# Patient Record
Sex: Male | Born: 1985 | Race: Black or African American | Hispanic: No | Smoking: Former smoker
Health system: Southern US, Community
[De-identification: ages and names within clinical notes are randomized; demographics above are authoritative.]

## PROBLEM LIST (undated history)

## (undated) DIAGNOSIS — N200 Calculus of kidney: Secondary | ICD-10-CM

## (undated) HISTORY — PX: HERNIA REPAIR: SHX51

---

## 2012-12-18 ENCOUNTER — Emergency Department (HOSPITAL_BASED_OUTPATIENT_CLINIC_OR_DEPARTMENT_OTHER): Payer: Self-pay

## 2012-12-18 ENCOUNTER — Encounter (HOSPITAL_BASED_OUTPATIENT_CLINIC_OR_DEPARTMENT_OTHER): Payer: Self-pay | Admitting: *Deleted

## 2012-12-18 ENCOUNTER — Emergency Department (HOSPITAL_BASED_OUTPATIENT_CLINIC_OR_DEPARTMENT_OTHER)
Admission: EM | Admit: 2012-12-18 | Discharge: 2012-12-18 | Disposition: A | Discharge status: Home / Self Care | Payer: Self-pay | Attending: Emergency Medicine | Admitting: Emergency Medicine

## 2012-12-18 DIAGNOSIS — Y929 Unspecified place or not applicable: Secondary | ICD-10-CM | POA: Insufficient documentation

## 2012-12-18 DIAGNOSIS — X500XXA Overexertion from strenuous movement or load, initial encounter: Secondary | ICD-10-CM | POA: Insufficient documentation

## 2012-12-18 DIAGNOSIS — N23 Unspecified renal colic: Secondary | ICD-10-CM | POA: Insufficient documentation

## 2012-12-18 DIAGNOSIS — Y9389 Activity, other specified: Secondary | ICD-10-CM | POA: Insufficient documentation

## 2012-12-18 DIAGNOSIS — F17200 Nicotine dependence, unspecified, uncomplicated: Secondary | ICD-10-CM | POA: Insufficient documentation

## 2012-12-18 DIAGNOSIS — Y990 Civilian activity done for income or pay: Secondary | ICD-10-CM | POA: Insufficient documentation

## 2012-12-18 LAB — URINALYSIS, ROUTINE W REFLEX MICROSCOPIC
Bilirubin Urine: NEGATIVE
Glucose, UA: NEGATIVE mg/dL
Nitrite: NEGATIVE
Specific Gravity, Urine: 1.024 (ref 1.005–1.030)
pH: 5.5 (ref 5.0–8.0)

## 2012-12-18 LAB — URINE MICROSCOPIC-ADD ON

## 2012-12-18 MED ORDER — OXYCODONE-ACETAMINOPHEN 5-325 MG PO TABS
ORAL_TABLET | ORAL | Status: AC
  Administered 2012-12-18: 2 via ORAL
  Filled 2012-12-18: qty 2

## 2012-12-18 MED ORDER — KETOROLAC TROMETHAMINE 60 MG/2ML IM SOLN
60.0000 mg | Freq: Once | INTRAMUSCULAR | Status: AC
  Administered 2012-12-18: 60 mg via INTRAMUSCULAR
  Filled 2012-12-18: qty 2

## 2012-12-18 MED ORDER — ONDANSETRON 4 MG PO TBDP
ORAL_TABLET | ORAL | Status: AC
  Administered 2012-12-18: 4 mg via ORAL
  Filled 2012-12-18: qty 1

## 2012-12-18 MED ORDER — ONDANSETRON 4 MG PO TBDP
4.0000 mg | ORAL_TABLET | Freq: Once | ORAL | Status: AC
  Administered 2012-12-18: 4 mg via ORAL

## 2012-12-18 MED ORDER — OXYCODONE-ACETAMINOPHEN 5-325 MG PO TABS: 2.0000 | ORAL_TABLET | ORAL | Status: DC | PRN

## 2012-12-18 MED ORDER — OXYCODONE-ACETAMINOPHEN 5-325 MG PO TABS
2.0000 | ORAL_TABLET | Freq: Once | ORAL | Status: AC
  Administered 2012-12-18: 2 via ORAL
  Filled 2012-12-18: qty 2

## 2012-12-18 NOTE — ED Notes (Signed)
MD at bedside. 

## 2012-12-18 NOTE — ED Provider Notes (Signed)
CSN: [id number]     Arrival date & time 12/18/12  1411 History   First MD Initiated Contact with Patient 12/18/12 1435     Chief Complaint  Patient presents with  . Testicle Pain   (Consider location/radiation/quality/duration/timing/severity/associated sxs/prior Treatment) Patient is a 27 y.o. male presenting with testicular pain.  Testicle Pain   Pt with history of kidney stones reports while he was at work yesterday evening lifting a box when he had sudden onset of severe aching R testicular pain, radiating into R flank. Not associated with testicular swelling, discharge or vomiting. He felt better during the night but pain returned today. This pain is not similar to previous kidney stones. Denies penile discharge recently but has had unprotected sex.   History reviewed. No pertinent past medical history. History reviewed. No pertinent past surgical history. History reviewed. No pertinent family history. History  Substance Use Topics  . Smoking status: Current Every Day Smoker -- 0.50 packs/day    Types: Cigarettes  . Smokeless tobacco: Not on file  . Alcohol Use: No    Review of Systems  Genitourinary: Positive for testicular pain.   All other systems reviewed and are negative except as noted in HPI.   Allergies  Review of patient's allergies indicates no known allergies.  Home Medications  No current outpatient prescriptions on file. BP 133/70  Pulse 72  Temp(Src) 97.7 F (36.5 C) (Oral)  Resp 16  Ht 5\' 6"  (1.676 m)  Wt 170 lb (77.111 kg)  BMI 27.45 kg/m2  SpO2 100% Physical Exam  Nursing note and vitals reviewed. Constitutional: He is oriented to person, place, and time. He appears well-developed and well-nourished.  HENT:  Head: Normocephalic and atraumatic.  Eyes: EOM are normal. Pupils are equal, round, and reactive to light.  Neck: Normal range of motion. Neck supple.  Cardiovascular: Normal rate, normal heart sounds and intact distal pulses.    Pulmonary/Chest: Effort normal and breath sounds normal.  Abdominal: Bowel sounds are normal. He exhibits no distension. There is no tenderness.  Genitourinary:  Uncircumcised, no discharge, no sores. Mild tenderness palpation R testicle which is retracted but normal orientation, L testicle non-tender. No inguinal hernias  Musculoskeletal: Normal range of motion. He exhibits no edema and no tenderness.  Neurological: He is alert and oriented to person, place, and time. He has normal strength. No cranial nerve deficit or sensory deficit.  Skin: Skin is warm and dry. No rash noted.  Psychiatric: He has a normal mood and affect.    ED Course  Procedures (including critical care time) Labs Review Labs Reviewed  URINALYSIS, ROUTINE W REFLEX MICROSCOPIC - Abnormal; Notable for the following:    Hgb urine dipstick LARGE (*)    Ketones, ur 15 (*)    All other components within normal limits  URINE MICROSCOPIC-ADD ON - Abnormal; Notable for the following:    Bacteria, UA FEW (*)    All other components within normal limits  GC/CHLAMYDIA PROBE AMP   Imaging Review No results found.  MDM  No diagnosis found.  Pt with moderate hematuria, may be kidney stone. Pt currently getting [place] to rule out torsion, care signed out to Dr. Blinda Leatherwood pending results.     Charles B. Bernette Mayers, MD 12/18/12 [phone]

## 2012-12-18 NOTE — ED Notes (Signed)
Pt called to nurses station stating that he feels like he is going to pass out or vomit due to the pain.  Dr Bernette Mayers informed, pt with stable vitals at this time.  Will continue to monitor.

## 2012-12-18 NOTE — ED Provider Notes (Signed)
Patient signed out to me by Doctor Bernette Mayers to followup on ultrasound. This had been seen for a right testicular pain and there was mild concern for possible version. Ultrasound shows no acute pathology the testicle including normal blood flow. Patient's urinalysis did show microscopic hematuria. Patient reports that he did have a kidney stone for 5 years ago, but does not remember what it felt like, cannot tell me if this feels similar. Patient therefore had CT scan which did show a small distal ureteral stone which splays the patient's symptoms. Patient will be treated with analgesia, follow up with urology.  Gilda Crease, MD 12/18/12 1640

## 2012-12-18 NOTE — ED Notes (Signed)
Pt c/o right testicular pain which radiates to back x 1 day

## 2012-12-18 NOTE — ED Notes (Signed)
Patient transported to [place] via stretcher per tech.

## 2013-04-28 ENCOUNTER — Emergency Department (HOSPITAL_BASED_OUTPATIENT_CLINIC_OR_DEPARTMENT_OTHER): Payer: Self-pay

## 2013-04-28 ENCOUNTER — Encounter (HOSPITAL_BASED_OUTPATIENT_CLINIC_OR_DEPARTMENT_OTHER): Payer: Self-pay | Admitting: Emergency Medicine

## 2013-04-28 ENCOUNTER — Emergency Department (HOSPITAL_BASED_OUTPATIENT_CLINIC_OR_DEPARTMENT_OTHER)
Admission: EM | Admit: 2013-04-28 | Discharge: 2013-04-28 | Disposition: A | Discharge status: Home / Self Care | Payer: Self-pay | Attending: Emergency Medicine | Admitting: Emergency Medicine

## 2013-04-28 DIAGNOSIS — H748X9 Other specified disorders of middle ear and mastoid, unspecified ear: Secondary | ICD-10-CM | POA: Insufficient documentation

## 2013-04-28 DIAGNOSIS — F17200 Nicotine dependence, unspecified, uncomplicated: Secondary | ICD-10-CM | POA: Insufficient documentation

## 2013-04-28 DIAGNOSIS — H65199 Other acute nonsuppurative otitis media, unspecified ear: Secondary | ICD-10-CM

## 2013-04-28 DIAGNOSIS — R55 Syncope and collapse: Secondary | ICD-10-CM | POA: Insufficient documentation

## 2013-04-28 LAB — CBC WITH DIFFERENTIAL/PLATELET
BASOS PCT: 0 % (ref 0–1)
Basophils Absolute: 0 10*3/uL (ref 0.0–0.1)
Eosinophils Absolute: 0.2 10*3/uL (ref 0.0–0.7)
Eosinophils Relative: 3 % (ref 0–5)
HEMATOCRIT: 40.3 % (ref 39.0–52.0)
HEMOGLOBIN: 13.6 g/dL (ref 13.0–17.0)
LYMPHS ABS: 3.4 10*3/uL (ref 0.7–4.0)
Lymphocytes Relative: 51 % — ABNORMAL HIGH (ref 12–46)
MCH: 29.8 pg (ref 26.0–34.0)
MCHC: 33.7 g/dL (ref 30.0–36.0)
MCV: 88.2 fL (ref 78.0–100.0)
MONO ABS: 0.6 10*3/uL (ref 0.1–1.0)
MONOS PCT: 8 % (ref 3–12)
NEUTROS ABS: 2.5 10*3/uL (ref 1.7–7.7)
Neutrophils Relative %: 37 % — ABNORMAL LOW (ref 43–77)
Platelets: 250 10*3/uL (ref 150–400)
RBC: 4.57 MIL/uL (ref 4.22–5.81)
RDW: 13.9 % (ref 11.5–15.5)
WBC: 6.7 10*3/uL (ref 4.0–10.5)

## 2013-04-28 LAB — BASIC METABOLIC PANEL
BUN: 9 mg/dL (ref 6–23)
CHLORIDE: 103 meq/L (ref 96–112)
CO2: 23 mEq/L (ref 19–32)
CREATININE: 1 mg/dL (ref 0.50–1.35)
Calcium: 9 mg/dL (ref 8.4–10.5)
GFR calc non Af Amer: >90 mL/min (ref >90)
GLUCOSE: 103 mg/dL — AB (ref 70–99)
POTASSIUM: 3.9 meq/L (ref 3.7–5.3)
Sodium: 140 mEq/L (ref 137–147)

## 2013-04-28 LAB — URINALYSIS, ROUTINE W REFLEX MICROSCOPIC
BILIRUBIN URINE: NEGATIVE
GLUCOSE, UA: NEGATIVE mg/dL
HGB URINE DIPSTICK: NEGATIVE
KETONES UR: NEGATIVE mg/dL
Leukocytes, UA: NEGATIVE
NITRITE: NEGATIVE
PH: 6 (ref 5.0–8.0)
Protein, ur: NEGATIVE mg/dL
SPECIFIC GRAVITY, URINE: 1.013 (ref 1.005–1.030)
Urobilinogen, UA: 0.2 mg/dL (ref 0.0–1.0)

## 2013-04-28 LAB — RAPID URINE DRUG SCREEN, HOSP PERFORMED
AMPHETAMINES: NOT DETECTED
BARBITURATES: NOT DETECTED
BENZODIAZEPINES: NOT DETECTED
COCAINE: NOT DETECTED
Opiates: NOT DETECTED
TETRAHYDROCANNABINOL: NOT DETECTED

## 2013-04-28 MED ORDER — AMOXICILLIN-POT CLAVULANATE 875-125 MG PO TABS: 1.0000 | ORAL_TABLET | Freq: Two times a day (BID) | ORAL | Status: DC

## 2013-04-28 NOTE — ED Notes (Addendum)
Pt states when he got out of the shower this evening he "passed out" and hit his head. No obvious injury noted  and states he "passed out" again this evening. States this was witnessed by his girlfriend. Denies any n/v/d. Denies any recent fevers. Denies any recent long car rides. Denies any sob. C/o dizziness at work for the past 4 days when bending over to pick up boxes. Denies any cp. Denies any new meds, foods, drugs, or supplements. Denies any dizziness at present. Denies any nasal congestion. 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

## 2013-04-28 NOTE — ED Provider Notes (Signed)
CSN: [id number]     Arrival date & time 04/28/13  4098 History   None    Chief Complaint  Patient presents with  . Near Syncope   (Consider location/radiation/quality/duration/timing/severity/associated sxs/prior Treatment) Patient is a 28 y.o. male presenting with syncope. The history is provided by the patient. No language interpreter was used.  Loss of Consciousness Episode history:  Single Most recent episode:  Today Timing:  Rare Progression:  Resolved Chronicity:  Recurrent Context: standing up   Context: not blood draw   Witnessed: yes   Relieved by:  Nothing Worsened by:  Nothing tried Ineffective treatments:  None tried Associated symptoms: no anxiety, no chest pain, no confusion, no diaphoresis, no difficulty breathing, no dizziness, no fever, no focal sensory loss, no focal weakness, no nausea, no palpitations, no recent surgery and no shortness of breath   Risk factors: no congenital heart disease and no coronary artery disease     Past Medical History  Diagnosis Date  . Seizures    Past Surgical History  Procedure Laterality Date  . Hernia repair     No family history on file. History  Substance Use Topics  . Smoking status: Current Every Day Smoker -- 0.50 packs/day    Types: Cigarettes  . Smokeless tobacco: Not on file  . Alcohol Use: Yes     Comment: occasional     Review of Systems  Constitutional: Negative for fever and diaphoresis.  Respiratory: Negative for shortness of breath.   Cardiovascular: Positive for syncope. Negative for chest pain and palpitations.  Gastrointestinal: Negative for nausea.  Neurological: Negative for dizziness and focal weakness.  Psychiatric/Behavioral: Negative for confusion.  All other systems reviewed and are negative.    Allergies  Review of patient's allergies indicates no known allergies.  Home Medications   Current Outpatient Rx  Name  Route  Sig  Dispense  Refill  . oxyCODONE-acetaminophen (PERCOCET)  5-325 MG per tablet   Oral   Take 2 tablets by mouth every 4 (four) hours as needed for pain.   20 tablet   0    BP 107/59  Pulse 62  Resp 18  SpO2 98% Physical Exam  Constitutional: He is oriented to person, place, and time. He appears well-developed and well-nourished. No distress.  HENT:  Head: Normocephalic and atraumatic.  Right Ear: No foreign bodies. No mastoid tenderness. Tympanic membrane is scarred. Tympanic membrane is not injected, not erythematous and not retracted. No hemotympanum.  Left Ear: No foreign bodies. No mastoid tenderness. Tympanic membrane is not injected, not scarred, not erythematous and not retracted. No hemotympanum.  Mouth/Throat: Oropharynx is clear and moist.  Gustavus Messing is not down and out  Eyes: Conjunctivae and EOM are normal. Pupils are equal, round, and reactive to light.  Neck: Normal range of motion. Neck supple.  Cardiovascular: Normal rate, regular rhythm and intact distal pulses.   Pulmonary/Chest: Effort normal. He has no wheezes. He has no rales.  Abdominal: Soft. Bowel sounds are normal. There is no tenderness. There is no rebound and no guarding.  Musculoskeletal: Normal range of motion.  Neurological: He is alert and oriented to person, place, and time. He has normal reflexes. No cranial nerve deficit. Coordination normal.  Skin: Skin is warm and dry.  Psychiatric: He has a normal mood and affect.    ED Course  Procedures (including critical care time) Labs Review Labs Reviewed - No data to display Imaging Review No results found.  EKG Interpretation   None  Date: 04/28/2013  Rate: 49  Rhythm: sinus bradycardia  QRS Axis: normal  Intervals: normal  ST/T Wave abnormalities: normal  Conduction Disutrbances: none  Narrative Interpretation: sinus bradycardia    MDM  No diagnosis found.  Patient orthostatic on vital signs and made no urine until 3 L nss were in. Patient feels markedly improved post NSS.   610 am case  d/w Dr. Jearld FentonByers of ENT via phone.  Start on Augmentin and have patient call Monday am to be seen early this week.    Informed patient of need to fill and take all antibiotics and call Monday am to be seen by Dr. Jearld FentonByers.  Patient and family verbalize understanding and agree to follow up   Dejae Bernet Smitty CordsK Kellyjo Edgren-Rasch, MD 04/28/13 [phone]

## 2014-04-27 ENCOUNTER — Emergency Department (HOSPITAL_BASED_OUTPATIENT_CLINIC_OR_DEPARTMENT_OTHER)
Admission: EM | Admit: 2014-04-27 | Discharge: 2014-04-27 | Disposition: A | Discharge status: Home / Self Care | Payer: Self-pay | Attending: Emergency Medicine | Admitting: Emergency Medicine

## 2014-04-27 ENCOUNTER — Encounter (HOSPITAL_BASED_OUTPATIENT_CLINIC_OR_DEPARTMENT_OTHER): Payer: Self-pay | Admitting: *Deleted

## 2014-04-27 DIAGNOSIS — K029 Dental caries, unspecified: Secondary | ICD-10-CM | POA: Insufficient documentation

## 2014-04-27 DIAGNOSIS — Z792 Long term (current) use of antibiotics: Secondary | ICD-10-CM | POA: Insufficient documentation

## 2014-04-27 DIAGNOSIS — Z87891 Personal history of nicotine dependence: Secondary | ICD-10-CM | POA: Insufficient documentation

## 2014-04-27 DIAGNOSIS — K047 Periapical abscess without sinus: Secondary | ICD-10-CM | POA: Insufficient documentation

## 2014-04-27 MED ORDER — CLINDAMYCIN HCL 150 MG PO CAPS: 150.0000 mg | ORAL_CAPSULE | Freq: Three times a day (TID) | ORAL | Status: DC

## 2014-04-27 MED ORDER — CLINDAMYCIN HCL 150 MG PO CAPS
300.0000 mg | ORAL_CAPSULE | Freq: Once | ORAL | Status: AC
  Administered 2014-04-27: 300 mg via ORAL
  Filled 2014-04-27: qty 2

## 2014-04-27 MED ORDER — HYDROCODONE-ACETAMINOPHEN 5-325 MG PO TABS: 1.0000 | ORAL_TABLET | Freq: Four times a day (QID) | ORAL | Status: DC | PRN

## 2014-04-27 NOTE — Discharge Instructions (Signed)

## 2014-04-27 NOTE — ED Provider Notes (Signed)
CSN: [id number]     Arrival date & time 04/27/14  2306 History  This chart was scribed for Hanley Seamen, MD by Abel Presto, ED Scribe. This patient was seen in room MH06/MH06 and the patient's care was started at 11:15 PM.      Chief Complaint  Patient presents with  . Facial Swelling      The history is provided by the patient. No language interpreter was used.  HPI Comments: Aaron Thornton is a 29 y.o. male who presents to the Emergency Department complaining of right sided facial swelling with onset yesterday. Pt notes symptoms began 3 days ago with throbbing dental pain in lower right. Pt notes associated neck swelling.  Pt has NKDA. Pt denies fever.   Past Medical History  Diagnosis Date  . Seizures     last seizure 2015   Past Surgical History  Procedure Laterality Date  . Hernia repair     No family history on file. History  Substance Use Topics  . Smoking status: Former Smoker -- 0.50 packs/day    Types: Cigarettes    Quit date: 04/11/2014  . Smokeless tobacco: Never Used  . Alcohol Use: Yes     Comment: occasional     Review of Systems A complete 10 system review of systems was obtained and all systems are negative except as noted in the HPI and PMH.     Allergies  Review of patient's allergies indicates no known allergies.  Home Medications   Prior to Admission medications   Medication Sig Start Date End Date Taking? Authorizing Provider  amoxicillin-clavulanate (AUGMENTIN) 875-125 MG per tablet Take 1 tablet by mouth 2 (two) times daily. One po bid x 10 days 04/28/13   April K Palumbo-Rasch, MD  clindamycin (CLEOCIN) 150 MG capsule Take 1 capsule (150 mg total) by mouth 3 (three) times daily. 04/27/14   Carlisle Beers Nikitia Asbill, MD  HYDROcodone-acetaminophen (NORCO/VICODIN) 5-325 MG per tablet Take 1-2 tablets by mouth every 6 (six) hours as needed (for pain). 04/27/14   Carlisle Beers Verginia Toohey, MD  oxyCODONE-acetaminophen (PERCOCET) 5-325 MG per tablet Take 2 tablets by mouth  every 4 (four) hours as needed for pain. 12/18/12   Gilda Crease, MD   BP 130/72 mmHg  Pulse 75  Temp(Src) 98.1 F (36.7 C) (Oral)  Resp 20  Ht  (1.702 m)  Wt 185 lb (83.915 kg)  BMI 28.97 kg/m2  SpO2 99%   Physical Exam General: Well-developed, well-nourished male in no acute distress; appearance consistent with age of record HENT: normocephalic; atraumatic Mouth: Carious right lower 2nd and 3rd molars; several missing teeth and carious left upper 2nd molar; swelling adjacent to the right mandible near 1st molar with tenderness  Eyes: pupils equal, round and reactive to light; extraocular muscles intact Neck: supple; right anterior cervical adenopathy Heart: regular rate and rhythm Lungs: clear to auscultation bilaterally Abdomen: soft; nondistended Extremities: No deformity; full range of motion Neurologic: Awake, alert and oriented; motor function intact in all extremities and symmetric; no facial droop Skin: Warm and dry Psychiatric: Normal mood and affect   ED Course  Procedures (including critical care time) DIAGNOSTIC STUDIES: Oxygen Saturation is 99% on room air, normal by my interpretation.    COORDINATION OF CARE: 11:20 PM Discussed treatment plan with patient at beside including Rx for Clindamycin, the patient agrees with the plan and has no further questions at this time.     MDM   Final diagnoses:  Dental abscess  Dental caries   I personally performed the services described in this documentation, which was scribed in my presence. The recorded information has been reviewed and is accurate.    Hanley SeamenJohn L Jonae Renshaw, MD 04/27/14 [phone]

## 2014-04-27 NOTE — ED Notes (Signed)
Pt ambulating independently w/ steady gait on d/c in no acute distress, A&Ox4. D/c instructions reviewed w/ pt and family - pt and family deny any further questions or concerns at present. Rx given x2  

## 2014-04-27 NOTE — ED Notes (Addendum)
right side facial swelling since Friday- toothache right lower jaw 4 days ago

## 2015-06-15 ENCOUNTER — Emergency Department (HOSPITAL_COMMUNITY)
Admission: EM | Admit: 2015-06-15 | Discharge: 2015-06-15 | Disposition: A | Discharge status: Home / Self Care | Payer: Managed Care, Other (non HMO) | Attending: Emergency Medicine | Admitting: Emergency Medicine

## 2015-06-15 ENCOUNTER — Encounter (HOSPITAL_COMMUNITY): Payer: Self-pay | Admitting: *Deleted

## 2015-06-15 DIAGNOSIS — R109 Unspecified abdominal pain: Secondary | ICD-10-CM | POA: Diagnosis not present

## 2015-06-15 DIAGNOSIS — M549 Dorsalgia, unspecified: Secondary | ICD-10-CM | POA: Insufficient documentation

## 2015-06-15 HISTORY — DX: Calculus of kidney: N20.0

## 2015-06-15 LAB — URINALYSIS, ROUTINE W REFLEX MICROSCOPIC
BILIRUBIN URINE: NEGATIVE
Glucose, UA: NEGATIVE mg/dL
Ketones, ur: NEGATIVE mg/dL
Nitrite: NEGATIVE
PH: 5.5 (ref 5.0–8.0)
Protein, ur: 30 mg/dL — AB
SPECIFIC GRAVITY, URINE: 1.019 (ref 1.005–1.030)

## 2015-06-15 LAB — URINE MICROSCOPIC-ADD ON

## 2015-06-15 MED ORDER — FENTANYL CITRATE (PF) 100 MCG/2ML IJ SOLN
50.0000 ug | Freq: Once | INTRAMUSCULAR | Status: AC
  Administered 2015-06-15: 50 ug via NASAL

## 2015-06-15 MED ORDER — FENTANYL CITRATE (PF) 100 MCG/2ML IJ SOLN
INTRAMUSCULAR | Status: AC
  Filled 2015-06-15: qty 2

## 2015-06-15 NOTE — ED Notes (Signed)
Pt reports onset this am of right side back/flank pain that radiates into his testicle. Difficulty urinating x 1 hour.

## 2015-06-15 NOTE — ED Notes (Signed)
Pt's name called to update vitals no answer 

## 2015-11-09 ENCOUNTER — Other Ambulatory Visit: Payer: Self-pay | Admitting: Otolaryngology

## 2015-11-09 DIAGNOSIS — H9211 Otorrhea, right ear: Secondary | ICD-10-CM

## 2015-11-09 DIAGNOSIS — H7291 Unspecified perforation of tympanic membrane, right ear: Secondary | ICD-10-CM

## 2015-11-09 DIAGNOSIS — H6983 Other specified disorders of Eustachian tube, bilateral: Secondary | ICD-10-CM

## 2016-10-20 ENCOUNTER — Encounter: Payer: Self-pay | Admitting: Neurology

## 2016-10-31 ENCOUNTER — Ambulatory Visit (INDEPENDENT_AMBULATORY_CARE_PROVIDER_SITE_OTHER): Payer: Managed Care, Other (non HMO) | Admitting: Neurology

## 2016-10-31 ENCOUNTER — Encounter: Payer: Self-pay | Admitting: Neurology

## 2016-10-31 VITALS — BP 120/78 | HR 63 | Ht 66.0 in | Wt 193.0 lb

## 2016-10-31 DIAGNOSIS — I951 Orthostatic hypotension: Secondary | ICD-10-CM

## 2016-10-31 DIAGNOSIS — Z87898 Personal history of other specified conditions: Secondary | ICD-10-CM | POA: Diagnosis not present

## 2016-10-31 NOTE — Progress Notes (Signed)
Note faxed.

## 2016-10-31 NOTE — Patient Instructions (Addendum)
Great to meet you! No driving restrictions, keep hydrated. Follow-up on as needed basis.

## 2016-10-31 NOTE — Progress Notes (Signed)
NEUROLOGY CONSULTATION NOTE  Aaron Thornton MRN: [id number] DOB: 1985-05-16  Referring provider: Dr. Guerry Bruin Primary care provider: Dr. Guerry Bruin  Reason for consult:  History of seizures in childhood, clearance for DOT  Dear Dr Wylene Simmer:  Thank you for your kind referral of Aaron Thornton for consultation of the above symptoms. Although his history is well known to you, please allow me to reiterate it for the purpose of our medical record. Records and images were personally reviewed where available.  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 He denies any other seizures or seizure-like symptoms. He was not started on any anti-epileptic medications. He denies any episodes of staring/unresponsiveness, gaps in time, olfactory/gustatory hallucinations, deja vu, rising epigastric sensation, focal numbness/tingling/weakness, myoclonic jerks. He has never woken up with a tongue bite or incontinence. Records from hospital visit in January 2015 were reviewed. He came back from working 3rd shift and went the whole night without drinking anything. He got home and was getting a haircut in the bathroom when he got hot and dizzy, passed out for 2 minutes with no convulsive activity. His eyes rolled back and he felt a little confused briefly after. In the ER, EKG showed sinus bradycardia at 49 bpm. He was reported to be orthostatic on vital signs and made no urine until 3 L of fluids were given. He felt markedly improved after fluids. He was diagnosed with an ear infection and started on Augmentin. He  denies any other syncopal episodes since then.   He has some neck and back pain. Otherwise he denies any headaches, dizziness, diplopia, dysarthria/dysphagia, focal numbness/tingling/weakness, bowel/bladder dysfunction. He was born premature at 7 months, normal development, no history of febrile convulsions, CNS infections such as meningitis/encephalitis, significant traumatic brain injury, neurosurgical procedures, or family history of seizures.  PAST MEDICAL HISTORY: Past Medical History:  Diagnosis Date  . Kidney stone   .         PAST SURGICAL HISTORY: Past Surgical History:  Procedure Laterality Date  . HERNIA REPAIR      MEDICATIONS:  Outpatient Encounter Prescriptions as of 10/31/2016  Medication Sig  . amoxicillin-clavulanate (AUGMENTIN) 875-125 MG per tablet Take 1 tablet by mouth 2 (two) times daily. One po bid x 10 days  . [DISCONTINUED] clindamycin (CLEOCIN) 150 MG capsule Take 1 capsule (150 mg total) by mouth 3 (three) times daily.  . [DISCONTINUED] HYDROcodone-acetaminophen (NORCO/VICODIN) 5-325 MG per tablet Take 1-2 tablets by mouth every 6 (six) hours as needed (for pain).  . [DISCONTINUED] oxyCODONE-acetaminophen (PERCOCET) 5-325 MG per tablet Take 2 tablets by mouth every 4 (four) hours as needed for pain.   No facility-administered encounter medications on file as of 10/31/2016.    ALLERGIES: No Known Allergies  FAMILY HISTORY: No family history on file.  SOCIAL HISTORY: Social History   Social History  . Marital status: Single    Spouse name: N/A  . Number of children: N/A  . Years of education: N/A   Occupational History  . Not on file.   Social History Main  Topics  . Smoking status: Former Smoker    Packs/day: 0.50    Types: Cigarettes    Quit date: 04/11/2014  . Smokeless tobacco: Never Used  . Alcohol use Yes     Comment: occasional   . Drug use: No  . Sexual activity: No   Other Topics Concern  . Not on file   Social History Narrative   . No narrative on file    REVIEW OF SYSTEMS: Constitutional: No fevers, chills, or sweats, no generalized fatigue, change in appetite Eyes: No visual changes, double vision, eye pain Ear, nose and throat: No hearing loss, ear pain, nasal congestion, sore throat Cardiovascular: No chest pain, palpitations Respiratory:  No shortness of breath at rest or with exertion, wheezes GastrointestinaI: No nausea, vomiting, diarrhea, abdominal pain, fecal incontinence Genitourinary:  No dysuria, urinary retention or frequency Musculoskeletal:  + occasional neck pain, back pain Integumentary: No rash, pruritus, skin lesions Neurological: as above Psychiatric: No depression, insomnia, anxiety Endocrine: No palpitations, fatigue, diaphoresis, mood swings, change in appetite, change in weight, increased thirst Hematologic/Lymphatic:  No anemia, purpura, petechiae. Allergic/Immunologic: no itchy/runny eyes, nasal congestion, recent allergic reactions, rashes  PHYSICAL EXAM: Vitals:   10/31/16 1028  BP: 120/78  Pulse: 63   General: No acute distress Head:  Normocephalic/atraumatic Eyes: Fundoscopic exam shows bilateral sharp discs, no vessel changes, exudates, or hemorrhages Neck: supple, no paraspinal tenderness, full range of motion Back: No paraspinal tenderness Heart: regular rate and rhythm Lungs: Clear to auscultation bilaterally. Vascular: No carotid bruits. Skin/Extremities: No rash, no edema Neurological Exam: Mental status: alert and oriented to person, place, and time, no dysarthria or aphasia, Fund of knowledge is appropriate.  Recent and remote memory are intact. 3/3 delayed recall. Attention and concentration are normal.    Able to name objects and repeat phrases. Cranial nerves: CN I: not tested CN II: pupils equal, round and reactive to light, visual fields intact, fundi unremarkable. CN III, IV, VI:  full range of motion, no nystagmus, no ptosis CN V: facial sensation  intact CN VII: upper and lower face symmetric CN VIII: hearing intact to finger rub CN IX, X: gag intact, uvula midline CN XI: sternocleidomastoid and trapezius muscles intact CN XII: tongue midline Bulk & Tone: normal, no fasciculations. Motor: 5/5 throughout with no pronator drift. Sensation: intact to light touch, cold, pin, vibration and joint position sense.  No extinction to double simultaneous stimulation.  Romberg test negative Deep Tendon Reflexes: +2 throughout, no ankle clonus Plantar responses: downgoing bilaterally Cerebellar: no incoordination on finger to nose, heel to shin. No dysdiadochokinesia Gait: narrow-based and steady, able to tandem walk adequately. Tremor: none  IMPRESSION: This is a very pleasant 31 year old left-handed man presenting for DOT clearance after he was denied due to report of seizure in 2015 and history of childhood seizure. On review of history, he had only one convulsion at age 89 or 89 and was never on anti-epileptic medication. The episode in January 2015 was associated with orthostatic vital signs on exam, with improvement after fluid hydration, suggestive of syncope due to orthostatic hypotension from dehydration. His neurological exam is normal, no clear epilepsy risk factors, no indication of epilepsy at this point in time. 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 He was instructed to keep hydrated at all times. He is aware of [place] driving laws to stop driving after an episode of loss of consciousness, until  6 months event-free. He will follow-up on a prn basis and knows to call for any changes.   Thank you for allowing me to participate in the care of this patient. Please do not hesitate to call for any questions or concerns.   Patrcia Dolly, M.D.  CC: Dr. Wylene Simmer, [facility], [facility] [place])

## 2017-06-09 ENCOUNTER — Encounter (HOSPITAL_BASED_OUTPATIENT_CLINIC_OR_DEPARTMENT_OTHER): Payer: Self-pay | Admitting: Emergency Medicine

## 2017-06-09 ENCOUNTER — Other Ambulatory Visit: Payer: Self-pay

## 2017-06-09 ENCOUNTER — Emergency Department (HOSPITAL_BASED_OUTPATIENT_CLINIC_OR_DEPARTMENT_OTHER)
Admission: EM | Admit: 2017-06-09 | Discharge: 2017-06-09 | Disposition: A | Discharge status: Home / Self Care | Payer: Managed Care, Other (non HMO) | Attending: Emergency Medicine | Admitting: Emergency Medicine

## 2017-06-09 DIAGNOSIS — H60503 Unspecified acute noninfective otitis externa, bilateral: Secondary | ICD-10-CM | POA: Insufficient documentation

## 2017-06-09 DIAGNOSIS — Z87891 Personal history of nicotine dependence: Secondary | ICD-10-CM | POA: Diagnosis not present

## 2017-06-09 DIAGNOSIS — H9203 Otalgia, bilateral: Secondary | ICD-10-CM | POA: Diagnosis present

## 2017-06-09 MED ORDER — AMOXICILLIN 500 MG PO CAPS
1000.0000 mg | ORAL_CAPSULE | Freq: Once | ORAL | Status: AC
  Administered 2017-06-09: 1000 mg via ORAL
  Filled 2017-06-09: qty 2

## 2017-06-09 MED ORDER — CIPROFLOXACIN-DEXAMETHASONE 0.3-0.1 % OT SUSP
4.0000 [drp] | Freq: Once | OTIC | Status: AC
  Administered 2017-06-09: 4 [drp] via OTIC
  Filled 2017-06-09: qty 7.5

## 2017-06-09 MED ORDER — IBUPROFEN 200 MG PO TABS
600.0000 mg | ORAL_TABLET | Freq: Once | ORAL | Status: AC
  Administered 2017-06-09: 15:00:00 600 mg via ORAL
  Filled 2017-06-09: qty 1

## 2017-06-09 MED ORDER — AMOXICILLIN 500 MG PO CAPS: 1000.0000 mg | ORAL_CAPSULE | Freq: Two times a day (BID) | ORAL | 0 refills | Status: AC

## 2017-06-09 MED ORDER — CIPROFLOXACIN-DEXAMETHASONE 0.3-0.1 % OT SUSP: 4.0000 [drp] | Freq: Two times a day (BID) | OTIC | 0 refills | Status: AC

## 2017-06-09 MED FILL — CIPRODEX OTIC SUSPENSION: 0.3-0.1 | 10 days supply | Qty: 8 | Fill #0

## 2017-06-09 MED FILL — AMOXICILLIN 500 MG CAPSULE: 500 | 10 days supply | Qty: 40 | Fill #0

## 2017-06-09 NOTE — ED Provider Notes (Signed)
[facility] EMERGENCY DEPARTMENT Provider Note   CSN: [id number] Arrival date & time: 06/09/17  1204     History   Chief Complaint Chief Complaint  Patient presents with  . Otalgia   HPI   Blood pressure 124/79, pulse 93, temperature 98.5 F (36.9 C), temperature source Oral, resp. rate 16, height 5\' 6"  (1.676 m), weight 81.6 kg (180 lb), SpO2 98 %.  Aaron Thornton is a 32 y.o. male complaining of right ear pain onset 1 month ago followed by left ear pain, he states that there is discharge from bilateral ears.  No fevers, chills, rhinorrhea, cough sore throat.  Does not have a history of frequent otitis media.   Past Medical History:  Diagnosis Date  . Kidney stone     Patient Active Problem List   Diagnosis Date Noted  . History of seizure 10/31/2016  . Syncope due to orthostatic hypotension 10/31/2016    Past Surgical History:  Procedure Laterality Date  . HERNIA REPAIR         Home Medications    Prior to Admission medications   Medication Sig Start Date End Date Taking? Authorizing Provider  amoxicillin (AMOXIL) 500 MG capsule Take 2 capsules (1,000 mg total) by mouth 2 (two) times daily. 06/09/17   Millisa Giarrusso, Joni Reining, PA-C  ciprofloxacin-dexamethasone (CIPRODEX) OTIC suspension Place 4 drops into both ears 2 (two) times daily. 06/09/17   Aja Bolander, Joni Reining, PA-C    Family History Family History  Problem Relation Age of Onset  . Asthma Brother     Social History Social History   Tobacco Use  . Smoking status: Former Smoker    Packs/day: 0.50    Types: Cigarettes    Last attempt to quit: 04/11/2014    Years since quitting: 3.1  . Smokeless tobacco: Never Used  Substance Use Topics  . Alcohol use: Yes    Comment: occasional   . Drug use: No     Allergies   Patient has no known allergies.   Review of Systems Review of Systems  A complete review of systems was obtained and all systems are negative except as noted in the HPI and PMH.    Physical Exam Updated Vital Signs BP 124/79   Pulse 93   Temp 98.5 F (36.9 C) (Oral)   Resp 16   Ht 5\' 6"  (1.676 m)   Wt 81.6 kg (180 lb)   SpO2 98%   BMI 29.05 kg/m   Physical Exam  Constitutional: He is oriented to person, place, and time. He appears well-developed and well-nourished. No distress.  HENT:  Head: Normocephalic and atraumatic.  Mouth/Throat: Oropharynx is clear and moist.  Bilateral external ear edema with purulent discharge, I am unable to visualize the tympanic membranes bilaterally.  Eyes: Conjunctivae and EOM are normal. Pupils are equal, round, and reactive to light.  Neck: Normal range of motion.  Cardiovascular: Normal rate, regular rhythm and intact distal pulses.  Pulmonary/Chest: Effort normal and breath sounds normal.  Abdominal: Soft. There is no tenderness.  Musculoskeletal: Normal range of motion.  Neurological: He is alert and oriented to person, place, and time.  Skin: He is not diaphoretic.  Psychiatric: He has a normal mood and affect.  Nursing note and vitals reviewed.    ED Treatments / Results  Labs (all labs ordered are listed, but only abnormal results are displayed) Labs Reviewed - No data to display  EKG  EKG Interpretation None       Radiology No results  found.  Procedures Procedures (including critical care time)  Medications Ordered in ED Medications  amoxicillin (AMOXIL) capsule 1,000 mg (1,000 mg Oral Given 06/09/17 1447)  ciprofloxacin-dexamethasone (CIPRODEX) 0.3-0.1 % OTIC (EAR) suspension 4 drop (4 drops Both EARS Given 06/09/17 1449)  ibuprofen (ADVIL,MOTRIN) tablet 600 mg (600 mg Oral Given 06/09/17 1450)     Initial Impression / Assessment and Plan / ED Course  I have reviewed the triage vital signs and the nursing notes.  Pertinent labs & imaging results that were available during my care of the patient were reviewed by me and considered in my medical decision making (see chart for details).      Vitals:   06/09/17 1222 06/09/17 1223  BP:  124/79  Pulse:  93  Resp:  16  Temp:  98.5 F (36.9 C)  TempSrc:  Oral  SpO2:  98%  Weight: 81.6 kg (180 lb)   Height: 5\' 6"  (1.676 m)     Medications  amoxicillin (AMOXIL) capsule 1,000 mg (1,000 mg Oral Given 06/09/17 1447)  ciprofloxacin-dexamethasone (CIPRODEX) 0.3-0.1 % OTIC (EAR) suspension 4 drop (4 drops Both EARS Given 06/09/17 1449)  ibuprofen (ADVIL,MOTRIN) tablet 600 mg (600 mg Oral Given 06/09/17 1450)    Aaron Thornton is 32 y.o. male presenting with otitis externa bilaterally, I cannot visualize abrasions, will treat for possible otitis media with amoxicillin recommend acetaminophen and ibuprofen for pain control at home.  Evaluation does not show pathology that would require ongoing emergent intervention or inpatient treatment. Pt is hemodynamically stable and mentating appropriately. Discussed findings and plan with patient/guardian, who agrees with care plan. All questions answered. Return precautions discussed and outpatient follow up given.      Final Clinical Impressions(s) / ED Diagnoses   Final diagnoses:  Acute otitis externa of both ears, unspecified type    ED Discharge Orders        Ordered    amoxicillin (AMOXIL) 500 MG capsule  2 times daily     06/09/17 1435    ciprofloxacin-dexamethasone (CIPRODEX) OTIC suspension  2 times daily     06/09/17 1435       Constantino Starace, Mardella Laymanicole, PA-C 06/09/17 1545    Charlynne PanderYao, David Hsienta, MD 06/11/17 [phone]

## 2017-06-09 NOTE — ED Triage Notes (Signed)
Pt c/o LT ear pain and drainage since yesterday

## 2017-06-09 NOTE — Discharge Instructions (Signed)
Please follow with your primary care doctor in the next 2 days for a check-up. They must obtain records for further management.  ° °Do not hesitate to return to the Emergency Department for any new, worsening or concerning symptoms.  ° °

## 2017-06-14 ENCOUNTER — Other Ambulatory Visit: Payer: Self-pay | Admitting: Otolaryngology

## 2017-06-14 DIAGNOSIS — H6983 Other specified disorders of Eustachian tube, bilateral: Secondary | ICD-10-CM

## 2017-06-14 DIAGNOSIS — H900 Conductive hearing loss, bilateral: Secondary | ICD-10-CM

## 2017-06-23 ENCOUNTER — Ambulatory Visit
Admission: RE | Admit: 2017-06-23 | Discharge: 2017-06-23 | Disposition: A | Discharge status: Home / Self Care | Payer: Managed Care, Other (non HMO) | Source: Ambulatory Visit | Attending: Otolaryngology | Admitting: Otolaryngology

## 2017-06-23 DIAGNOSIS — H900 Conductive hearing loss, bilateral: Secondary | ICD-10-CM

## 2017-06-23 DIAGNOSIS — H6983 Other specified disorders of Eustachian tube, bilateral: Secondary | ICD-10-CM

## 2019-03-06 IMAGING — CT CT TEMPORAL BONES W/O CM
1 series · 15 of 30 positions shown, 19 images · non-contrast
Comparison: None.

CLINICAL DATA: 32 y/o M; bilateral hearing loss. Right-sided for a
couple months and left side for 2 weeks. Bilateral myringotomy tubes
as child.

EXAM:
CT TEMPORAL BONES WITHOUT CONTRAST
TECHNIQUE: Axial and coronal plane CT imaging of the petrous temporal bones was
performed with thin-collimation image reconstruction. No intravenous
contrast was administered. Multiplanar CT image reconstructions were
also generated.

[Series 4: axial soft · axial · 0.47mm/px · z∈[-122,-68]mm · 15 of 30 slices shown, 19 images]
[im 2/30  brain]
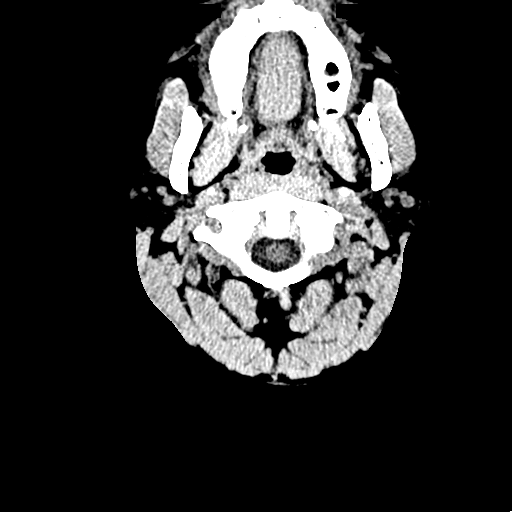
[im 2/30  bone]
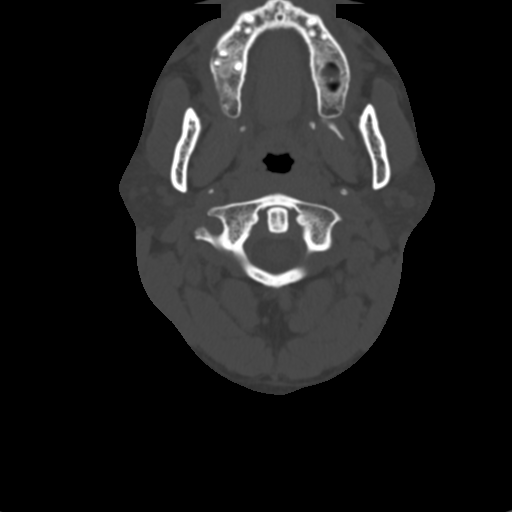
[im 4/30  bone]
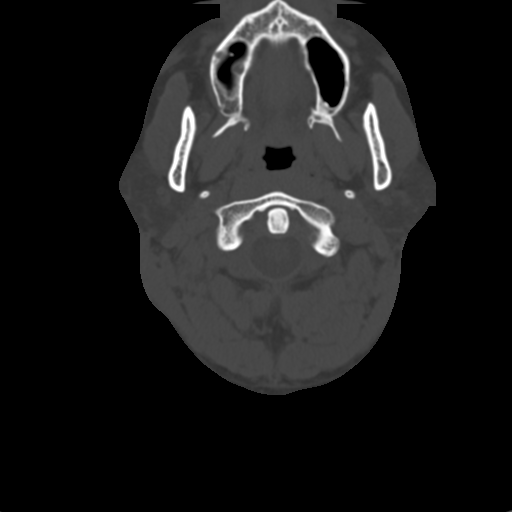
[im 6/30  bone]
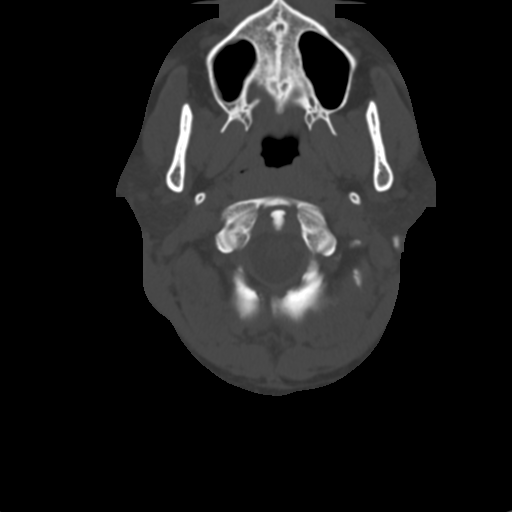
[im 8/30  bone]
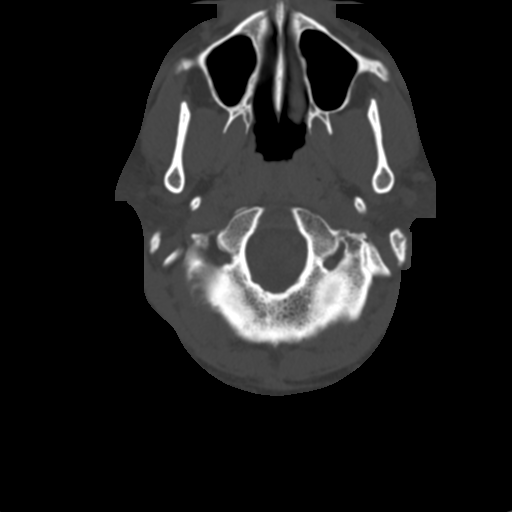
[im 10/30  brain]
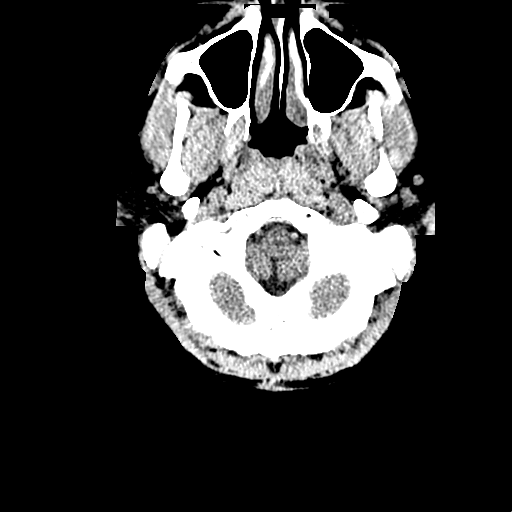
[im 10/30  bone]
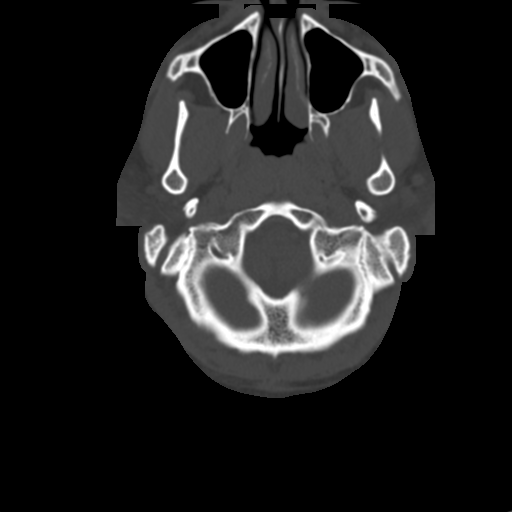
[im 12/30  bone]
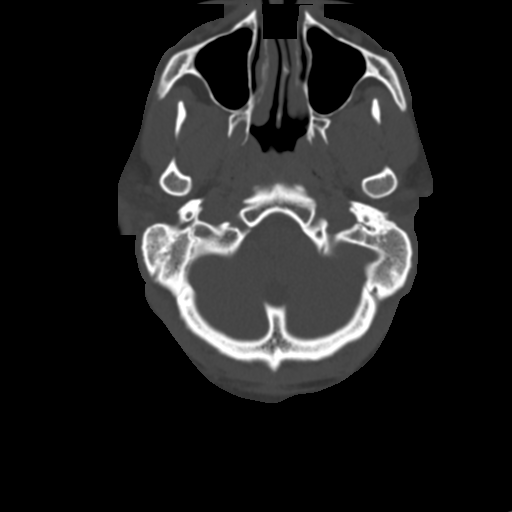
[im 14/30  bone]
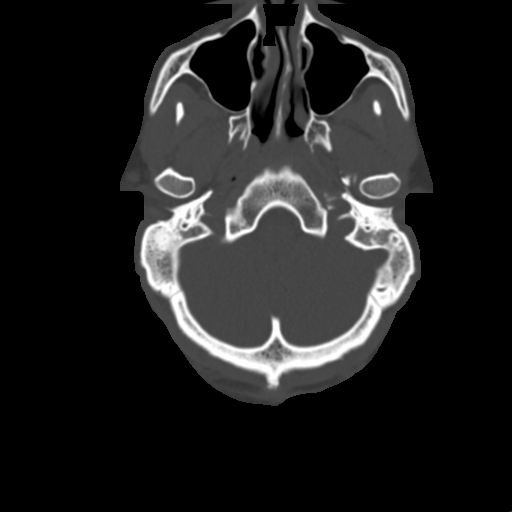
[im 16/30  bone]
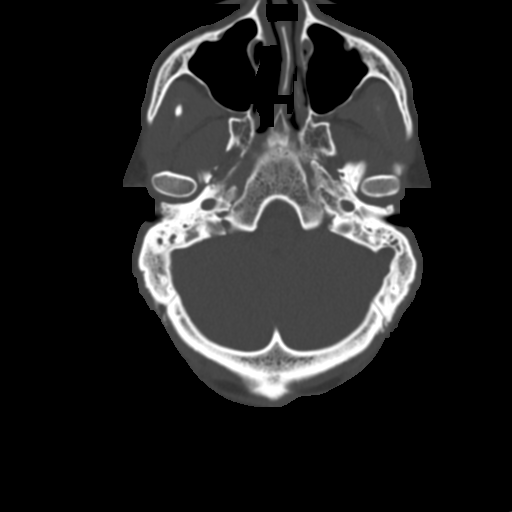
[im 17/30  brain]
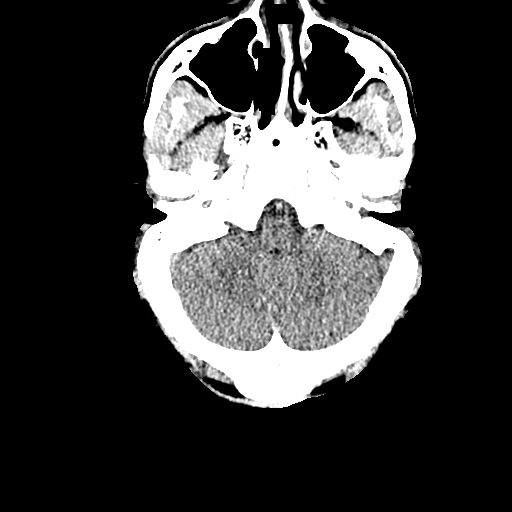
[im 17/30  bone]
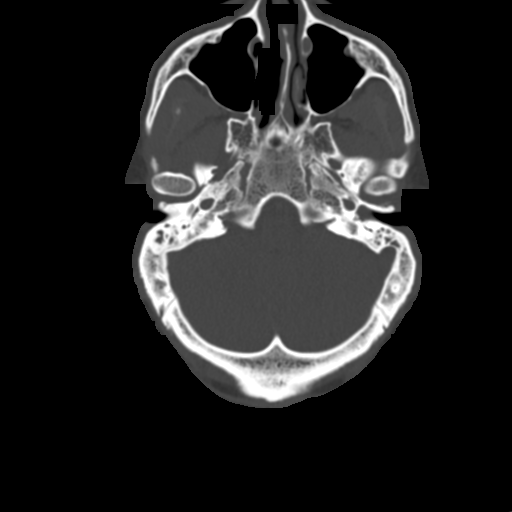
[im 19/30  bone]
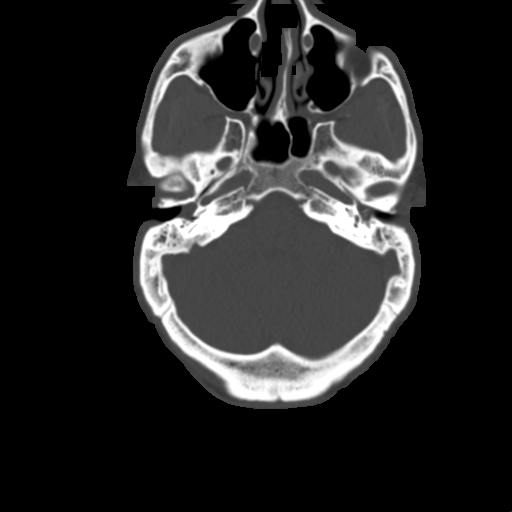
[im 21/30  bone]
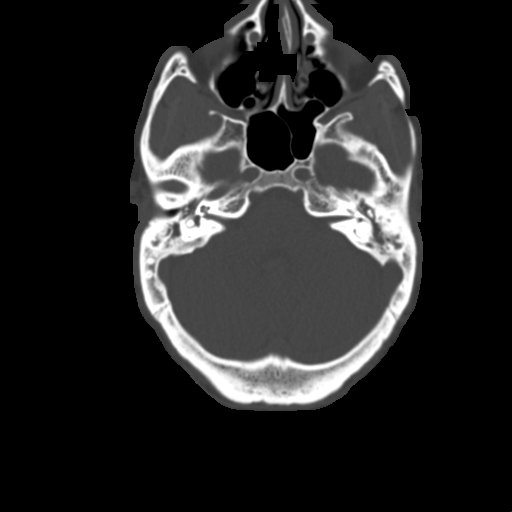
[im 23/30  bone]
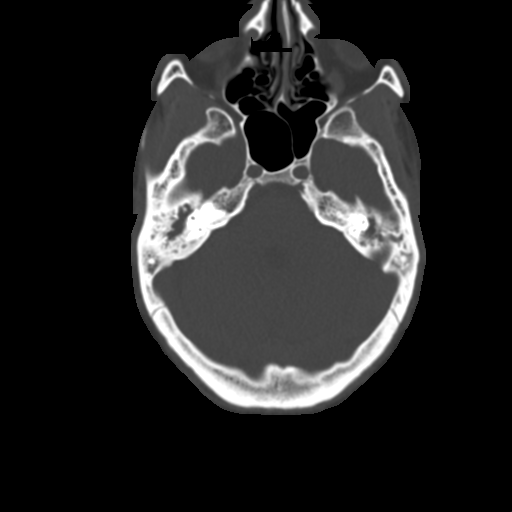
[im 25/30  brain]
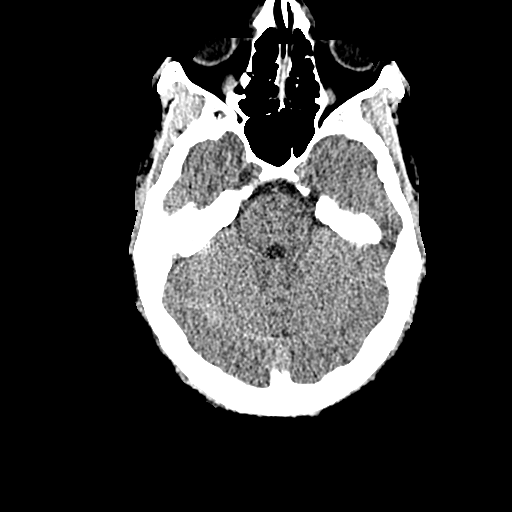
[im 25/30  bone]
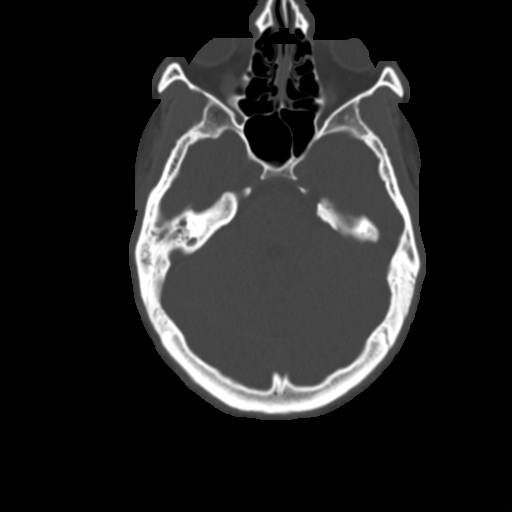
[im 27/30  bone]
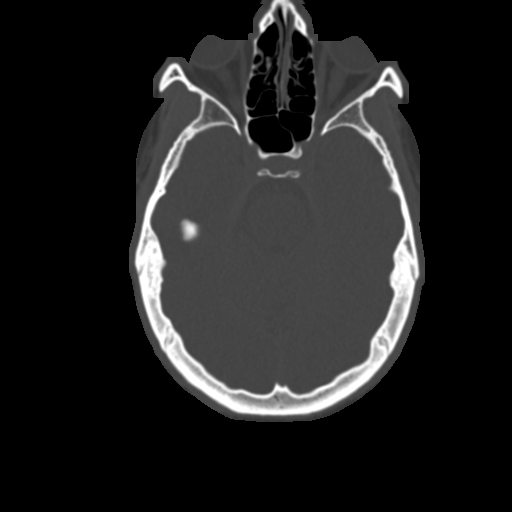
[im 29/30  bone]
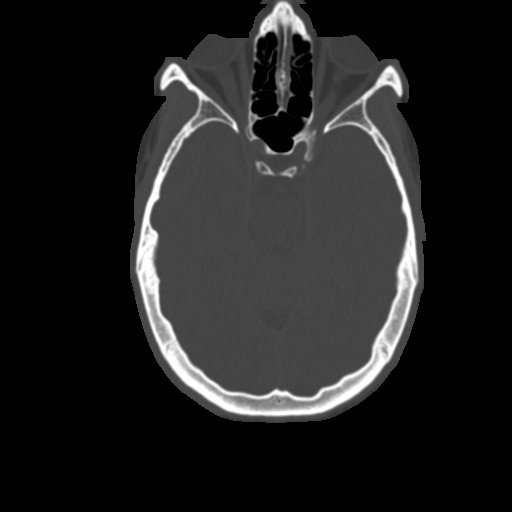

[15 of 30 positions shown; findings below may reference images not displayed]

FINDINGS: Right ear: Mild smooth soft tissue thickening of the walls of
external auditory canal, no bony erosive change. Tympanic membrane
obscured by middle ear cavity opacification. No blunting of the
scutum. The ossicles are intact without gross erosion. Normal course
of the facial nerve. Normal semicircular canals and
vestibulocochlear apparatus. Complete opacification of mastoid air
cells and middle ear cavity.

Left ear: Moderate smooth soft tissue thickening of walls of
external auditory canal, no bony erosive change. Tympanic membrane
obscured by middle ear cavity opacification. No blunting of the
scutum. The ossicles are intact without gross erosion. Normal course
of the facial nerve. Normal semicircular canals and
vestibulocochlear apparatus. Complete opacification of mastoid air
cells and middle ear cavity.

Other: Mild right maxillary sinus mucosal thickening. Periapical
dental disease of the right posterior most maxillary molar. No acute
process identified in the intracranial compartment. Visualized
orbits are unremarkable.
IMPRESSION: 1. Complete opacification of bilateral middle ear cavities and
mastoid air cells. No bony erosion to suggest cholesteatoma or
coalescent mastoiditis. No tegmen tympani or mastoideum dehiscence.
2. Left-greater-than-right smooth soft tissue thickening of walls of
external auditory canal may represent a component of otitis externa,
reactive edema, or sequelae of chronic inflammation.
3. Morphologically normal inner ear structures.
4. Mild right maxillary sinus mucosal thickening.
5. Periapical dental disease associated with right posterior most
maxillary molar.

By: Shoshoo Meraz M.D.
